# Patient Record
Sex: Male | Born: 2000 | Race: Black or African American | Hispanic: No | Marital: Single | Smoking: Never smoker
Health system: Southern US, Community
[De-identification: ages and names within clinical notes are randomized; demographics above are authoritative.]

## PROBLEM LIST (undated history)

## (undated) DIAGNOSIS — J189 Pneumonia, unspecified organism: Secondary | ICD-10-CM

## (undated) HISTORY — PX: CIRCUMCISION: SUR203

## (undated) HISTORY — DX: Pneumonia, unspecified organism: J18.9

---
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
## 2021-01-21 ENCOUNTER — Telehealth: Payer: Self-pay | Admitting: *Deleted

## 2021-01-21 NOTE — Telephone Encounter (Signed)
LMVM- On 11/05/2020 we enrolled you for a company named [employer] to mail a ZIO XT monitor to your home.  On 01/20/21 we received the following message from [facility].  Good afternoon,  Our office received a ZIO XT Home Enrollment order from  office in which the address is reflecting invalid in USPS address verification system:    The address for this contact is not valid or could not be verified. USPS Details: [place] Input address matched to the ZIP+4 file. [place] address missing secondary number.    Patients Initials: HG Patient IR:[id number] Ticket:[id number]   Please give [place] a call to confirm the patient address so we can proceed with shipping this order to the patient. IF YOU NEED ANY ADDITIONAL INFORMATION PLEASE CALL [employer] Customer Service [phone].  Thank you, [employer] Customer Support  ref:_[email or web address]:ref  Please contact our office at [phone] to update your address.

## 2021-02-13 ENCOUNTER — Telehealth: Payer: Self-pay | Admitting: *Deleted

## 2021-02-13 NOTE — Telephone Encounter (Signed)
° °  Pt's mom calling, she said they receive a notification that the heat monitor was returned due to pt's address. She provided pt's correct address: [address] [place], [place] [address]

## 2021-02-13 NOTE — Telephone Encounter (Signed)
Confirmed address had been corrected in Epic.  Sent message to [employer] to please ship a monitor to the corrected address.

## 2021-03-03 DIAGNOSIS — Z113 Encounter for screening for infections with a predominantly sexual mode of transmission: Secondary | ICD-10-CM | POA: Diagnosis not present

## 2021-04-24 DIAGNOSIS — R002 Palpitations: Secondary | ICD-10-CM | POA: Diagnosis not present

## 2021-04-24 DIAGNOSIS — Z0000 Encounter for general adult medical examination without abnormal findings: Secondary | ICD-10-CM | POA: Diagnosis not present

## 2021-04-24 DIAGNOSIS — R5383 Other fatigue: Secondary | ICD-10-CM | POA: Diagnosis not present

## 2021-04-24 DIAGNOSIS — Z833 Family history of diabetes mellitus: Secondary | ICD-10-CM | POA: Diagnosis not present

## 2021-04-29 NOTE — Progress Notes (Signed)
?Cardiology Office Note:   ? ?Date:  04/30/2021  ? ?ID:  Kyle Pacheco., DOB 07-08-2000, MRN [id number] ? ?PCP:  Ancil Linsey, MD ?[facility] Cardiologist: Little Ishikawa, MD  ? ?Reason for visit: Follow-up ? ?History of Present Illness:   ? ?Kyle Pacheco. is a 21 y.o. male with a family history of hypertrophic heart cardiomyopathy.  His mother follows with Dr. Bjorn Pippin and has an ICD for her hypertrophic cardiomyopathy.  Patient reported palpitations described as fluttering 1-2 times per week in the summer.  Patient is in school at [employer], plays on football and wrestling team. ? ?He last saw Dr. Bjorn Pippin in October 2022.  Echo and Zio patch were ordered.  It showed EF of 55 to 60%, no LVH, normal RV systolic function, mildly enlarged RV size, normal pulmonary pressures, mildly dilated right atria, mild to moderate tricuspid valve regurgitation. ? ?He saw his PCP on March 30 and complained of palpitations and chest pain.  Therefore follow-up with cardiology was arranged for today. ? ?Today, he states he never received a Zio patch in October 2022.  He complains about 2 different palpitation feelings.   ? ?He feels his heart fluttering proximately 1-2 times every 2 weeks since he was 21 years old.  He describes this as a fish flopping on a deck.  This is associated with some chest discomfort/pressure, shortness of breath and lightheadedness.  This occurs suddenly and lasts for 5 to 15 seconds. ? ?For the past 1.5 months, he feels his heart rate sometimes slow down where he feels like something is grabbing his heart and pressing on it.  This also lasts between 5 to 15 seconds and makes him feel tired and nauseous and that he needs to stop what he is doing.  This occurs 2-6 times per day. ? ?Otherwise, he states he is able to workout without chest pain and shortness of breath.  He also denies PND, orthopnea, lower extremity edema and syncope.  He had blood work with his PCP on March  30 with normal thyroid, CBC and metabolic panel. ? ?  ?Past Medical History:  ?Diagnosis Date  ? Pneumonia 2015  ? RLL  ? ? ?Past Surgical History:  ?Procedure Laterality Date  ? CIRCUMCISION    ? at 21 years of age  ? ? ?Current Medications: ?No outpatient medications have been marked as taking for the 04/30/21 encounter (Office Visit) with Cannon Kettle, PA-C.  ?  ? ?Allergies:   Patient has no known allergies.  ? ?Social History  ? ?Socioeconomic History  ? Marital status: Single  ?  Spouse name: Not on file  ? Number of children: Not on file  ? Years of education: Not on file  ? Highest education level: Not on file  ?Occupational History  ? Not on file  ?Tobacco Use  ? Smoking status: Never  ?  Passive exposure: Yes  ? Smokeless tobacco: Never  ?Vaping Use  ? Vaping Use: Never used  ?Substance and Sexual Activity  ? Alcohol use: No  ? Drug use: No  ? Sexual activity: Not on file  ?Other Topics Concern  ? Not on file  ?Social History Narrative  ? Not on file  ? ?Social Determinants of Health  ? ?Financial Resource Strain: Not on file  ?Food Insecurity: Not on file  ?Transportation Needs: Not on file  ?Physical Activity: Not on file  ?Stress: Not on file  ?Social Connections: Not on file  ?  ? ?  Family History: ?The patient's family history includes Diabetes in his maternal grandfather; Hypertension in his maternal grandfather, maternal grandmother, and mother. ? ?ROS:   ?Please see the history of present illness.    ? ?EKGs/Labs/Other Studies Reviewed:   ? ?EKG:  The ekg ordered today demonstrates sinus bradycardia with early repolarization, heart rate 44, PR interval 146 ms QRS 86 ms.. ? ?Recent Labs: ?No results found for requested labs within last 8760 hours.  ? ?Recent Lipid Panel ?No results found for: CHOL, TRIG, HDL, LDLCALC, LDLDIRECT ? ?Physical Exam:   ? ?VS:  BP 126/72   Pulse (!) 44   Ht 5\' 9"  (1.753 m)   Wt 214 lb 6.4 oz (97.3 kg)   SpO2 99%   BMI 31.66 kg/m?    ?No data found. ? ?Wt Readings  from Last 3 Encounters:  ?04/30/21 214 lb 6.4 oz (97.3 kg)  ?11/05/20 219 lb 9.6 oz (99.6 kg) (97 %, Z= 1.86)*  ?08/20/20 220 lb 9.6 oz (100.1 kg) (97 %, Z= 1.89)*  ? ?* Growth percentiles are based on CDC (Boys, 2-20 Years) data.  ?  ? ?GEN:  Well nourished, well developed in no acute distress ?HEENT: Normal ?NECK: No JVD; No carotid bruits ?CARDIAC: RRR, no murmurs, rubs, gallops ?RESPIRATORY:  Clear to auscultation without rales, wheezing or rhonchi  ?ABDOMEN: Soft, non-tender, non-distended ?MUSCULOSKELETAL: No edema; No deformity  ?SKIN: Warm and dry ?NEUROLOGIC:  Alert and oriented ?PSYCHIATRIC:  Normal affect  ? ?  ?ASSESSMENT AND PLAN  ? ?Palpitations ?-EKG today shows sinus bradycardia with early repolarization, heart rate 44, PR interval 146 ms QRS 86 ms.. ?-Order 2-week Zio patch. ? ?Family history of hypertrophic cardiomyopathy ?-mother has HOCM with ICD in place ?-2D echo 11/2020 with no LVH ? ?Tricuspid regurgitation ?-Mild to moderate on echo November 2022 ?-No heart failure symptoms. ? ?Disposition - Follow-up in 2 to 3 months with Dr. December 2022. ? ? ?Medication Adjustments/Labs and Tests Ordered: ?Current medicines are reviewed at length with the patient today.  Concerns regarding medicines are outlined above.  ?Orders Placed This Encounter  ?Procedures  ? LONG TERM MONITOR (3-14 DAYS)  ? EKG 12-Lead  ? ?No orders of the defined types were placed in this encounter. ? ? ?Patient Instructions  ?Medication Instructions:  ?None ?*If you need a refill on your cardiac medications before your next appointment, please call your pharmacy* ? ? ?Lab Work: ?None ?If you have labs (blood work) drawn today and your tests are completely normal, you will receive your results only by: ?MyChart Message (if you have MyChart) OR ?A paper copy in the mail ?If you have any lab test that is abnormal or we need to change your treatment, we will call you to review the results. ? ? ?Testing/Procedures: ?ZIO XT- Long Term  Monitor Instructions ? ?Your physician has requested you wear a ZIO patch monitor for 14 days.  ?This is a single patch monitor. [employer] supplies one patch monitor per enrollment. Additional ?stickers are not available. Please do not apply patch if you will be having a Nuclear Stress Test,  ?Echocardiogram, Cardiac CT, MRI, or Chest Xray during the period you would be wearing the  ?monitor. The patch cannot be worn during these tests. You cannot remove and re-apply the  ?ZIO XT patch monitor.  ?Your ZIO patch monitor will be mailed 3 day USPS to your address on file. It may take 3-5 days  ?to receive your monitor after you have been enrolled.  ?Once  you have received your monitor, please review the enclosed instructions. Your monitor  ?has already been registered assigning a specific monitor serial # to you. ? ?Billing and Patient Assistance Program Information ? ?We have supplied [employer] with any of your insurance information on file for billing purposes. ?[employer] offers a sliding scale Patient Assistance Program for patients that do not have  ?insurance, or whose insurance does not completely cover the cost of the ZIO monitor.  ?You must apply for the Patient Assistance Program to qualify for this discounted rate.  ?To apply, please call [employer] at [phone], select option 4, select option 2, ask to apply for  ?Patient Assistance Program. Meredeth [employer] will ask your household income, and how many people  ?are in your household. They will quote your out-of-pocket cost based on that information.  ?[employer] will also be able to set up a 4599-month, interest-free payment plan if needed. ? ?Applying the monitor ?  ?Shave hair from upper left chest.  ?Hold abrader disc by orange tab. Rub abrader in 40 strokes over the upper left chest as  ?indicated in your monitor instructions.  ?Clean area with 4 enclosed alcohol pads. Let dry.  ?Apply patch as indicated in monitor instructions. Patch will be placed under collarbone on left   ?side of chest with arrow pointing upward.  ?Rub patch adhesive wings for 2 minutes. Remove white label marked "1". Remove the white  ?label marked "2". Rub patch adhesive wings for 2 additional minutes.  ?Wh

## 2021-04-30 ENCOUNTER — Ambulatory Visit: Payer: Medicaid Other

## 2021-04-30 ENCOUNTER — Ambulatory Visit (INDEPENDENT_AMBULATORY_CARE_PROVIDER_SITE_OTHER): Payer: Medicaid Other | Admitting: Physician Assistant

## 2021-04-30 ENCOUNTER — Encounter: Payer: Self-pay | Admitting: Physician Assistant

## 2021-04-30 VITALS — BP 126/72 | HR 44 | Ht 69.0 in | Wt 214.4 lb

## 2021-04-30 DIAGNOSIS — R002 Palpitations: Secondary | ICD-10-CM | POA: Diagnosis not present

## 2021-04-30 DIAGNOSIS — R072 Precordial pain: Secondary | ICD-10-CM

## 2021-04-30 NOTE — Patient Instructions (Signed)
Medication Instructions:  ?None ?*If you need a refill on your cardiac medications before your next appointment, please call your pharmacy* ? ? ?Lab Work: ?None ?If you have labs (blood work) drawn today and your tests are completely normal, you will receive your results only by: ?MyChart Message (if you have MyChart) OR ?A paper copy in the mail ?If you have any lab test that is abnormal or we need to change your treatment, we will call you to review the results. ? ? ?Testing/Procedures: ?ZIO XT- Long Term Monitor Instructions ? ?Your physician has requested you wear a ZIO patch monitor for 14 days.  ?This is a single patch monitor. [employer] supplies one patch monitor per enrollment. Additional ?stickers are not available. Please do not apply patch if you will be having a Nuclear Stress Test,  ?Echocardiogram, Cardiac CT, MRI, or Chest Xray during the period you would be wearing the  ?monitor. The patch cannot be worn during these tests. You cannot remove and re-apply the  ?ZIO XT patch monitor.  ?Your ZIO patch monitor will be mailed 3 day USPS to your address on file. It may take 3-5 days  ?to receive your monitor after you have been enrolled.  ?Once you have received your monitor, please review the enclosed instructions. Your monitor  ?has already been registered assigning a specific monitor serial # to you. ? ?Billing and Patient Assistance Program Information ? ?We have supplied [employer] with any of your insurance information on file for billing purposes. ?[employer] offers a sliding scale Patient Assistance Program for patients that do not have  ?insurance, or whose insurance does not completely cover the cost of the ZIO monitor.  ?You must apply for the Patient Assistance Program to qualify for this discounted rate.  ?To apply, please call [employer] at [phone], select option 4, select option 2, ask to apply for  ?Patient Assistance Program. Meredeth Ide will ask your household income, and how many people  ?are in  your household. They will quote your out-of-pocket cost based on that information.  ?[employer] will also be able to set up a 21-month, interest-free payment plan if needed. ? ?Applying the monitor ?  ?Shave hair from upper left chest.  ?Hold abrader disc by orange tab. Rub abrader in 40 strokes over the upper left chest as  ?indicated in your monitor instructions.  ?Clean area with 4 enclosed alcohol pads. Let dry.  ?Apply patch as indicated in monitor instructions. Patch will be placed under collarbone on left  ?side of chest with arrow pointing upward.  ?Rub patch adhesive wings for 2 minutes. Remove white label marked "1". Remove the white  ?label marked "2". Rub patch adhesive wings for 2 additional minutes.  ?While looking in a mirror, press and release button in center of patch. A small green light will  ?flash 3-4 times. This will be your only indicator that the monitor has been turned on.  ?Do not shower for the first 24 hours. You may shower after the first 24 hours.  ?Press the button if you feel a symptom. You will hear a small click. Record Date, Time and  ?Symptom in the Patient Logbook.  ?When you are ready to remove the patch, follow instructions on the last 2 pages of Patient  ?Logbook. Stick patch monitor onto the last page of Patient Logbook.  ?Place Patient Logbook in the blue and white box. Use locking tab on box and tape box closed  ?securely. The blue and white box has prepaid  postage on it. Please place it in the mailbox as  ?soon as possible. Your physician should have your test results approximately 7 days after the  ?monitor has been mailed back to [facility].  ?Call [facility] at [phone] if you have questions regarding  ?your ZIO XT patch monitor. Call them immediately if you see an orange light blinking on your  ?monitor.  ?If your monitor falls off in less than 4 days, contact our Monitor department at [phone].  ?If your monitor becomes loose or falls off  after 4 days call [employer] at [phone] for  ?suggestions on securing your monitor  ?Follow-Up: ?At [facility], you and your health needs are our priority.  As part of our continuing mission to provide you with exceptional heart care, we have created designated Provider Care Teams.  These Care Teams include your primary Cardiologist (physician) and Advanced Practice Providers (APPs -  Physician Assistants and Nurse Practitioners) who all work together to provide you with the care you need, when you need it. ? ?We recommend signing up for the patient portal called "MyChart".  Sign up information is provided on this After Visit Summary.  MyChart is used to connect with patients for Virtual Visits (Telemedicine).  Patients are able to view lab/test results, encounter notes, upcoming appointments, etc.  Non-urgent messages can be sent to your provider as well.   ?To learn more about what you can do with MyChart, go to [email or web address].   ? ?Your next appointment:   ?2-3 month(s) ? ?The format for your next appointment:   ?In Person ? ?Provider:   ?Little Ishikawa, MD   ? ? ?  ?

## 2021-04-30 NOTE — Progress Notes (Unsigned)
Enrolled for [employer] to mail a ZIO XT long term holter monitor to the patients address on file.  ? ?Dr. Schumann to read. ?

## 2021-07-27 NOTE — Progress Notes (Signed)
Cardiology Office Note:    Date:  08/01/2021   ID:  Kyle Gearing., DOB [birth year], MRN [id number]  PCP:  Ancil Linsey, MD  Cardiologist:  Little Ishikawa, MD  Electrophysiologist:  None   Referring MD: Ancil Linsey, MD   Chief Complaint  Patient presents with   Palpitations    History of Present Illness:    Kyle Banko. is a 21 y.o. male with no significant past medical history who presents for follow-up.  He was referred by Dr. Kennedy Bucker for evaluation of family history of hypertrophic cardiomyopathy, initially seen 11/05/2020.  His mother, Kyle Pacheco, is a patient of mine and has hypertrophic cardiomyopathy and had ICD placed.  He denies any chest pain, dyspnea, headedness, syncope, or lower extremity edema.  Does report having palpitations.  Describes as fluttering feeling, was occurring 1-2 times per week in the summer, but none for the last 3 weeks.  He denies any lower extremity edema.  Does not smoke.  He is in school at [employer], plays on football and wrestling team.  Echocardiogram 12/03/2020 showed normal biventricular function, no LVH, mild to moderate TR.  Since last clinic visit, he reports that he is doing okay.  Palpitations have improved, now occurring about twice per week lasting for 5 to 10 seconds.  Reports having chest pain about once per week, describes as right upper chest pain that occurs with coughing or deep breathing.  Lasts for few seconds and resolves.  He denies any dyspnea, lightheadedness, syncope, lower extremity edema.   Past Medical History:  Diagnosis Date   Pneumonia 2015   RLL    Past Surgical History:  Procedure Laterality Date   CIRCUMCISION     at 21 years of age    Current Medications: No outpatient medications have been marked as taking for the 08/01/21 encounter (Office Visit) with Little Ishikawa, MD.     Allergies:   Patient has no known allergies.   Social History   Socioeconomic History   Marital  status: Single    Spouse name: Not on file   Number of children: Not on file   Years of education: Not on file   Highest education level: Not on file  Occupational History   Not on file  Tobacco Use   Smoking status: Never    Passive exposure: Yes   Smokeless tobacco: Never  Vaping Use   Vaping Use: Never used  Substance and Sexual Activity   Alcohol use: No   Drug use: No   Sexual activity: Not on file  Other Topics Concern   Not on file  Social History Narrative   Not on file   Social Determinants of Health   Financial Resource Strain: Not on file  Food Insecurity: Not on file  Transportation Needs: Not on file  Physical Activity: Not on file  Stress: Not on file  Social Connections: Not on file     Family History: The patient's family history includes Diabetes in his maternal grandfather; Hypertension in his maternal grandfather, maternal grandmother, and mother.  ROS:   Please see the history of present illness.     All other systems reviewed and are negative.  EKGs/Labs/Other Studies Reviewed:    The following studies were reviewed today:   EKG:  EKG is not ordered today.    Recent Labs: No results found for requested labs within last 365 days.  Recent Lipid Panel No results found for: "CHOL", "TRIG", "HDL", "CHOLHDL", "VLDL", "  LDLCALC", "LDLDIRECT"  Physical Exam:    VS:  BP 110/78   Pulse 89   Ht 5\' 9"  (1.753 m)   Wt 211 lb 9.6 oz (96 kg)   SpO2 99%   BMI 31.25 kg/m     Wt Readings from Last 3 Encounters:  08/01/21 211 lb 9.6 oz (96 kg)  04/30/21 214 lb 6.4 oz (97.3 kg)  11/05/20 219 lb 9.6 oz (99.6 kg) (97 %, Z= 1.86)*   * Growth percentiles are based on CDC (Boys, 2-20 Years) data.     GEN:  Well nourished, well developed in no acute distress HEENT: Normal NECK: No JVD; No carotid bruits LYMPHATICS: No lymphadenopathy CARDIAC: RRR, no murmurs, rubs, gallops RESPIRATORY:  Clear to auscultation without rales, wheezing or rhonchi   ABDOMEN: Soft, non-tender, non-distended MUSCULOSKELETAL:  No edema; No deformity  SKIN: Warm and dry NEUROLOGIC:  Alert and oriented x 3 PSYCHIATRIC:  Normal affect   ASSESSMENT:    1. Family history of hypertrophic cardiomyopathy   2. Palpitations   3. Precordial chest pain   4. Tricuspid valve insufficiency, unspecified etiology     PLAN:    Family history of hypertrophic cardiomyopathy: Mother has HCM.  Echocardiogram 12/03/2020 showed no LVH.  Palpitations: Description concerning for arrhythmia, he wore Zio patch x2 weeks but did not mail it back.  He brought his monitor with him today as he lost the box to mail it back.  We discussed with [employer], they will mail a box to him so he can send the monitor back to them -Follow-up monitor results  Tricuspid regurgitation: Mild to moderate TR on echo 12/03/2020, will monitor  Chest pain: Description suggest noncardiac etiology, as describes right-sided pain that occurs with coughing and lasts for few seconds, suspect musculoskeletal chest pain.  No exertional chest pain  RTC in 1 year  Medication Adjustments/Labs and Tests Ordered: Current medicines are reviewed at length with the patient today.  Concerns regarding medicines are outlined above.  No orders of the defined types were placed in this encounter.  No orders of the defined types were placed in this encounter.    Patient Instructions  Medication Instructions:  Your physician recommends that you continue on your current medications as directed. Please refer to the Current Medication list given to you today.  *If you need a refill on your cardiac medications before your next appointment, please call your pharmacy*   Lab Work: NONE If you have labs (blood work) drawn today and your tests are completely normal, you will receive your results only by: MyChart Message (if you have MyChart) OR A paper copy in the mail If you have any lab test that is abnormal or we need  to change your treatment, we will call you to review the results.   Testing/Procedures: NONE   Follow-Up: At [facility], you and your health needs are our priority.  As part of our continuing mission to provide you with exceptional heart care, we have created designated Provider Care Teams.  These Care Teams include your primary Cardiologist (physician) and Advanced Practice Providers (APPs -  Physician Assistants and Nurse Practitioners) who all work together to provide you with the care you need, when you need it.  Your next appointment:   1 year(s)  The format for your next appointment:   In Person  Provider:   [facility], MD {    Other Instructions NONE          Signed, Little Ishikawa  Karlyne Greenspan, MD  08/01/2021 8:57 AM    [facility]

## 2021-08-01 ENCOUNTER — Ambulatory Visit (INDEPENDENT_AMBULATORY_CARE_PROVIDER_SITE_OTHER): Payer: Medicaid Other | Admitting: Cardiology

## 2021-08-01 ENCOUNTER — Encounter: Payer: Self-pay | Admitting: Cardiology

## 2021-08-01 VITALS — BP 110/78 | HR 89 | Ht 69.0 in | Wt 211.6 lb

## 2021-08-01 DIAGNOSIS — R002 Palpitations: Secondary | ICD-10-CM

## 2021-08-01 DIAGNOSIS — Z8249 Family history of ischemic heart disease and other diseases of the circulatory system: Secondary | ICD-10-CM | POA: Diagnosis not present

## 2021-08-01 DIAGNOSIS — I071 Rheumatic tricuspid insufficiency: Secondary | ICD-10-CM

## 2021-08-01 DIAGNOSIS — R072 Precordial pain: Secondary | ICD-10-CM | POA: Diagnosis not present

## 2021-08-01 NOTE — Patient Instructions (Signed)
Medication Instructions:  Your physician recommends that you continue on your current medications as directed. Please refer to the Current Medication list given to you today.  *If you need a refill on your cardiac medications before your next appointment, please call your pharmacy*   Lab Work: NONE If you have labs (blood work) drawn today and your tests are completely normal, you will receive your results only by: MyChart Message (if you have MyChart) OR A paper copy in the mail If you have any lab test that is abnormal or we need to change your treatment, we will call you to review the results.   Testing/Procedures: NONE   Follow-Up: At [facility], you and your health needs are our priority.  As part of our continuing mission to provide you with exceptional heart care, we have created designated Provider Care Teams.  These Care Teams include your primary Cardiologist (physician) and Advanced Practice Providers (APPs -  Physician Assistants and Nurse Practitioners) who all work together to provide you with the care you need, when you need it.  Your next appointment:   1 year(s)  The format for your next appointment:   In Person  Provider:   Little Ishikawa, MD {    Other Instructions NONE

## 2021-08-13 DIAGNOSIS — I959 Hypotension, unspecified: Secondary | ICD-10-CM | POA: Diagnosis not present

## 2021-08-13 DIAGNOSIS — R55 Syncope and collapse: Secondary | ICD-10-CM | POA: Diagnosis not present

## 2021-08-13 DIAGNOSIS — R1111 Vomiting without nausea: Secondary | ICD-10-CM | POA: Diagnosis not present

## 2021-08-13 DIAGNOSIS — R110 Nausea: Secondary | ICD-10-CM | POA: Diagnosis not present

## 2021-11-20 ENCOUNTER — Emergency Department (HOSPITAL_BASED_OUTPATIENT_CLINIC_OR_DEPARTMENT_OTHER)
Admission: EM | Admit: 2021-11-20 | Discharge: 2021-11-20 | Disposition: A | Discharge status: Home / Self Care | Payer: Medicaid Other | Attending: Emergency Medicine | Admitting: Emergency Medicine

## 2021-11-20 ENCOUNTER — Ambulatory Visit
Admission: EM | Admit: 2021-11-20 | Discharge: 2021-11-20 | Disposition: A | Discharge status: Home / Self Care | Payer: Medicaid Other

## 2021-11-20 ENCOUNTER — Encounter: Payer: Self-pay | Admitting: Emergency Medicine

## 2021-11-20 ENCOUNTER — Encounter (HOSPITAL_BASED_OUTPATIENT_CLINIC_OR_DEPARTMENT_OTHER): Payer: Self-pay | Admitting: Emergency Medicine

## 2021-11-20 ENCOUNTER — Other Ambulatory Visit: Payer: Self-pay

## 2021-11-20 ENCOUNTER — Emergency Department (HOSPITAL_BASED_OUTPATIENT_CLINIC_OR_DEPARTMENT_OTHER): Payer: Medicaid Other

## 2021-11-20 DIAGNOSIS — S0990XA Unspecified injury of head, initial encounter: Secondary | ICD-10-CM | POA: Diagnosis not present

## 2021-11-20 DIAGNOSIS — W228XXA Striking against or struck by other objects, initial encounter: Secondary | ICD-10-CM | POA: Diagnosis not present

## 2021-11-20 DIAGNOSIS — Z7722 Contact with and (suspected) exposure to environmental tobacco smoke (acute) (chronic): Secondary | ICD-10-CM | POA: Insufficient documentation

## 2021-11-20 DIAGNOSIS — Y9361 Activity, american tackle football: Secondary | ICD-10-CM | POA: Insufficient documentation

## 2021-11-20 DIAGNOSIS — S060X0A Concussion without loss of consciousness, initial encounter: Secondary | ICD-10-CM

## 2021-11-20 DIAGNOSIS — R519 Headache, unspecified: Secondary | ICD-10-CM

## 2021-11-20 DIAGNOSIS — S0990XD Unspecified injury of head, subsequent encounter: Secondary | ICD-10-CM | POA: Diagnosis not present

## 2021-11-20 NOTE — ED Triage Notes (Signed)
Pt arrives to ED with c/o concussion. Pt reports head injury on 10/21. He reports headache, photosensitivity, fatigue, trouble sleeping, and memory loss.

## 2021-11-20 NOTE — Discharge Instructions (Signed)
Note your work-up today was overall reassuring.  Imaging of your head showed no acute abnormalities.  Recommend resting from physical exercise or any activity that could result in additional head trauma.  I provided a school note to allow your brain time to rest.  Recommend reevaluation by your primary care provider in 3 to 5 days.  Take Tylenol/ibuprofen as needed for pain/headache.  Please do not hesitate to return to emergency department for worrisome signs and symptoms we discussed become apparent.

## 2021-11-20 NOTE — Discharge Instructions (Addendum)
Advised to report to the emergency room for evaluation of concussion.

## 2021-11-20 NOTE — ED Triage Notes (Signed)
Pt had possible head injury during football game on Monday; pt sts was seen by athletic trainers that day; pt sts having sound and light sensitivity and lethargy since injury

## 2021-11-20 NOTE — ED Provider Notes (Addendum)
MEDCENTER [facility] EMERGENCY DEPT Provider Note   CSN: [id number] Arrival date & time: 11/20/21  1321     History  Chief Complaint  Patient presents with   Concussion    Kyle Pacheco. is a 21 y.o. male.  HPI   21 year old male presents emergency department with complaints of head trauma.  Patient states that he was in a football game this past Sunday and had multiple episodes where he was hit in the head.  He denies loss of consciousness, episodes of nausea/vomiting.  He states that since then, he has had difficulty with headache, light sensitivity, sensitivity to sound, generalized fatigue, difficulty concentrating.  Patient states he was seen by an urgent care and sent to emergency department for further evaluation.  Patient is accompanied by mother who is concerned about intracranial pathology and is requesting CT imaging of the head.  Denies visual disturbance, weakness/sensory deficits, feelings of imbalance, slurred speech, facial droop.  Denies current blood thinner use.  No significant past medical history.  Home Medications Prior to Admission medications   Not on File      Allergies    Patient has no known allergies.    Review of Systems   Review of Systems  All other systems reviewed and are negative.   Physical Exam Updated Vital Signs BP 135/62 (BP Location: Right Arm)   Pulse 60   Temp 98.4 F (36.9 C)   Resp 14   Ht 5\' 9"  (1.753 m)   Wt 97.5 kg   SpO2 100%   BMI 31.75 kg/m  Physical Exam Vitals and nursing note reviewed.  Constitutional:      General: He is not in acute distress.    Appearance: He is well-developed.     Comments: Patient wearing sunglasses in no acute distress.  HENT:     Head: Normocephalic and atraumatic.  Eyes:     Conjunctiva/sclera: Conjunctivae normal.  Cardiovascular:     Rate and Rhythm: Normal rate and regular rhythm.     Heart sounds: No murmur heard. Pulmonary:     Effort: Pulmonary effort is normal. No  respiratory distress.     Breath sounds: Normal breath sounds.  Abdominal:     Palpations: Abdomen is soft.     Tenderness: There is no abdominal tenderness.  Musculoskeletal:        General: No swelling.     Cervical back: Neck supple.  Skin:    General: Skin is warm and dry.     Capillary Refill: Capillary refill takes less than 2 seconds.  Neurological:     Mental Status: He is alert.     Comments: Alert and oriented to self, place, time and event.   Speech is fluent, clear without dysarthria or dysphasia.   Strength 5/5 in upper/lower extremities   Sensation intact in upper/lower extremities   Normal gait.  Negative Romberg. No pronator drift.  Normal finger-to-nose and feet tapping.  CN I not tested  CN II grossly intact visual fields bilaterally. Did not visualize posterior eye.  CN III, IV, VI PERRLA and EOMs intact bilaterally  CN V Intact sensation to sharp and light touch to the face  CN VII facial movements symmetric  CN VIII not tested  CN IX, X no uvula deviation, symmetric rise of soft palate  CN XI 5/5 SCM and trapezius strength bilaterally  CN XII Midline tongue protrusion, symmetric L/R movements   Psychiatric:        Mood and Affect: Mood normal.  ED Results / Procedures / Treatments   Labs (all labs ordered are listed, but only abnormal results are displayed) Labs Reviewed - No data to display  EKG None  Radiology CT Head Wo Contrast  Result Date: 11/20/2021 CLINICAL DATA:  Head trauma, moderate-severe EXAM: CT HEAD WITHOUT CONTRAST TECHNIQUE: Contiguous axial images were obtained from the base of the skull through the vertex without intravenous contrast. RADIATION DOSE REDUCTION: This exam was performed according to the departmental dose-optimization program which includes automated exposure control, adjustment of the mA and/or kV according to patient size and/or use of iterative reconstruction technique. COMPARISON:  None Available. FINDINGS:  Brain: No evidence of acute infarction, hemorrhage, hydrocephalus, extra-axial collection or mass lesion/mass effect. Vascular: No hyperdense vessel identified. Skull: No acute fracture. Sinuses/Orbits: Visualized sinuses are clear. No acute orbital findings. Other: No mastoid effusions. IMPRESSION: No evidence of acute intracranial abnormality. Electronically Signed   By: Feliberto Harts M.D.   On: 11/20/2021 15:19    Procedures Procedures    Medications Ordered in ED Medications - No data to display  ED Course/ Medical Decision Making/ A&P                           Medical Decision Making Amount and/or Complexity of Data Reviewed Radiology: ordered.   This patient presents to the ED for concern of head injury, this involves an extensive number of treatment options, and is a complaint that carries with it a high risk of complications and morbidity.  The differential diagnosis includes CVA, fracture, strain/sprain, dislocation, concussion   Co morbidities that complicate the patient evaluation  See HPI   Additional history obtained:  Additional history obtained from EMR External records from outside source obtained and reviewed including urgent care record from earlier today   Lab Tests:  N/a   Imaging Studies ordered:  I ordered imaging studies including CT head I independently visualized and interpreted imaging which showed no acute abnormalities I agree with the radiologist interpretation   Cardiac Monitoring: / EKG:  The patient was maintained on a cardiac monitor.  I personally viewed and interpreted the cardiac monitored which showed an underlying rhythm of: Sinus rhythm   Consultations Obtained:  N/a   Problem List / ED Course / Critical interventions / Medication management  Head trauma Reevaluation of the patient showed that the patient stayed the same I have reviewed the patients home medicines and have made adjustments as needed   Social  Determinants of Health:  Secondhand smoking.  Denies illicit drug use.   Test / Admission - Considered:  Head injury Vitals signs within normal range and stable throughout visit. Imaging studies significant for: See above Patient symptoms likely secondary to concussion.  CT imaging of the head negative for any acute abnormalities.  Imaging was obtained at patient's request given multiple visits.  Patient reassured with overall negative findings.  Symptomatic therapy recommended at home with Tylenol/ibuprofen as needed for pain/headache.  Recommend rest physical activity as well as academic rigors until tolerable, obtaining adequate rest and hydration/food intake.Marland Kitchen  Recommend follow-up with PCP in 3 to 5 days.  Treatment plan discussed with patient and he acknowledged understand was agreeable to said plan. Worrisome signs and symptoms were discussed with the patient, and the patient acknowledged understanding to return to the ED if noticed. Patient was stable upon discharge.          Final Clinical Impression(s) / ED Diagnoses Final diagnoses:  Injury  of head, subsequent encounter    Rx / DC Orders ED Discharge Orders     None         Wilnette Kales, [place] 11/20/21 [place], [place], [place] 11/20/21 1545    Lajean Saver, MD 11/21/21 1407

## 2021-11-20 NOTE — ED Provider Notes (Signed)
[facility]    CSN: [id number] Arrival date & time: 11/20/21  2595      History   Chief Complaint Chief Complaint  Patient presents with   Head Injury    HPI Kyle Pacheco. is a 21 y.o. male.   21 year old male presents for concussion.  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  Patient indicates he did not have LOC.  Patient indicates he continued to play throughout the rest of the game without problems.  Patient indicates that after the game he started having mild headache and fatigue.  He indicates that on Sunday he started having light sensitivity, sound sensitivity, fatigue, dull persistent headache, with intermittent episodes of dizziness and feeling off balance when he walked.  Patient indicates he did not have any nausea or vomiting.  He indicates he saw the athletic trainers on Monday and they did a concussion exam which he indicates was positive.  He indicates the athletic trainers took him out of play until the symptoms resolved.  Patient comes in today and indicates he is still having light sensitivity, he is wearing sunglasses in the exam room, sound sensitivity, headache which is dull and a 6 on a scale of 1-10.  He also indicates that he is having some intermittent dizziness when he walks feeling like he is off balance.  He also indicates he is having fatigue and tired feeling.  He indicates that he is having problems focusing and gathering his thoughts and expressing thoughts and ideas through his speech.  He does indicate that he has some mild headache on the posterior side of his neck.  He is not having any unusual numbness or tingling of the upper or lower extremities or weakness.  He denies having any nausea or vomiting, no vision changes.   Head Injury Associated symptoms: headache     Past Medical  History:  Diagnosis Date   Pneumonia 2015   RLL    There are no problems to display for this patient.   Past Surgical History:  Procedure Laterality Date   CIRCUMCISION     at 21 years of age       Home Medications    Prior to Admission medications   Not on File    Family History Family History  Problem Relation Age of Onset   Hypertension Mother    Hypertension Maternal Grandmother    Diabetes Maternal Grandfather    Hypertension Maternal Grandfather     Social History Social History   Tobacco Use   Smoking status: Never    Passive exposure: Yes   Smokeless tobacco: Never  Vaping Use   Vaping Use: Never used  Substance Use Topics   Alcohol use: No   Drug use: No     Allergies   Patient has no known allergies.   Review of Systems Review of Systems  Constitutional:  Positive for fatigue.  Neurological:  Positive for light-headedness and headaches.     Physical Exam Triage Vital Signs ED Triage Vitals  Enc Vitals Group     BP 11/20/21 0843 113/65     Pulse Rate 11/20/21 0843 (!) 54     Resp 11/20/21 0843 18     Temp 11/20/21 0843 97.9 F (36.6 C)     Temp Source 11/20/21 0843 Oral  SpO2 11/20/21 0843 98 %     Weight --      Height --      Head Circumference --      Peak Flow --      Pain Score 11/20/21 0844 5     Pain Loc --      Pain Edu? --      Excl. in [place]? --    No data found.  Updated Vital Signs BP 113/65 (BP Location: Left Arm)   Pulse (!) 54   Temp 97.9 F (36.6 C) (Oral)   Resp 18   SpO2 98%   Visual Acuity Right Eye Distance:   Left Eye Distance:   Bilateral Distance:    Right Eye Near:   Left Eye Near:    Bilateral Near:     Physical Exam Constitutional:      Appearance: Normal appearance.     Comments: Patient appears normal in appearance but is slow to respond to questions although he does respond to pointed and focus questions rationally and appropriately.  HENT:     Right Ear: Tympanic membrane and  ear canal normal.     Left Ear: Tympanic membrane and ear canal normal.     Mouth/Throat:     Mouth: Mucous membranes are moist.     Pharynx: Oropharynx is clear. Uvula midline.  Eyes:     General: Vision grossly intact. Gaze aligned appropriately.     Extraocular Movements: Extraocular movements intact.  Cardiovascular:     Rate and Rhythm: Normal rate and regular rhythm.     Heart sounds: Normal heart sounds.  Pulmonary:     Effort: Pulmonary effort is normal.     Breath sounds: Normal breath sounds and air entry. No wheezing, rhonchi or rales.  Musculoskeletal:     Cervical back: Full passive range of motion without pain.  Lymphadenopathy:     Cervical: No cervical adenopathy.  Neurological:     Mental Status: He is alert and oriented to person, place, and time.     Cranial Nerves: Cranial nerves 2-12 are intact.     Motor: Motor function is intact.     Coordination: Coordination is intact.      UC Treatments / Results  Labs (all labs ordered are listed, but only abnormal results are displayed) Labs Reviewed - No data to display  EKG   Radiology No results found.  Procedures Procedures (including critical care time)  Medications Ordered in UC Medications - No data to display  Initial Impression / Assessment and Plan / UC Course  I have reviewed the triage vital signs and the nursing notes.  Pertinent labs & imaging results that were available during my care of the patient were reviewed by me and considered in my medical decision making (see chart for details).    Plan: 1.  Concussion will be treated with the following: A.  Patient advised to continue to be out of school and no stimulation until advised otherwise, also out of football practice or training activity until advised otherwise. B.  Patient advised to report to the emergency room for evaluation and possible head CT scan. (patient indicates that he has an appointment to be evaluated by [facility] this morning at 9:30 AM) 2.  The headache will be treated with the following: A.  Advised take Tylenol only for headache when needed. 3.  Patient advised to follow-up with PCP or [facility] in order to get a return to  play form completed over the next several weeks. 4.  Advised to return to urgent care as needed. Final Clinical Impressions(s) / UC Diagnoses   Final diagnoses:  Concussion without loss of consciousness, initial encounter  Acute nonintractable headache, unspecified headache type     Discharge Instructions      Advised to report to the emergency room for evaluation of concussion.    ED Prescriptions   None    PDMP not reviewed this encounter.   Nyoka Lint, PA-C 11/20/21 [phone]

## 2022-03-09 DIAGNOSIS — Z113 Encounter for screening for infections with a predominantly sexual mode of transmission: Secondary | ICD-10-CM | POA: Diagnosis not present

## 2022-04-06 ENCOUNTER — Encounter: Payer: Self-pay | Admitting: Physical Medicine and Rehabilitation

## 2022-04-07 NOTE — Progress Notes (Signed)
Subjective:    Patient ID: Kyle Hertz., male    DOB: [birth year], 22 y.o.   MRN: [id number]  HPI Kyle Buntin. is a 22 y.o. year old male  who  has a past medical history of Pneumonia (2015).   They are presenting to PM&R clinic as a new patient for treatment of post-concussive syndrome . They were referred by Cleophas Dunker, MD  .  He had his initial injury in October  at [facility], playing football fell back and hit his head on the backside on the turf 2x. He also had side impacts 2 more times during that game with head strikes. He denies LOC, +helmet. No N/V at the time. He played the entire game, and didn't really notice symptoms until the next day. He had an evaluation by athletic trainers the next week.   He subsequently got a CT head in ED, which was normal. He was recommended tylenol and ibuprofen. He was referred to [place] by his athletic trainers; has not had a physician eval since. He has not been cleared to return to play, lift weights, or run. His trainers have taken him through the concussion baseline protocol test and worked on balance.   He denies vertigo, dizziness, light  sensitivity, nausea.   Chief complaint: Memory difficulty/ Focus difficulty. He is still attending classes, but finding it difficult to concentrate on professors, material. 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 He denies any accommodations for classroom after his concussion.   2nd complaint: Depression, pre-existing injury, worsened since. Does not see a therapist or psychiatrist. Never been on antidepressants.   3rd: Headaches, starting in the base of the skull and radiating to his temples. Intermittent, overall improving.   Pain Inventory Average Pain 0 Pain Right Now 0 My pain is intermittent and pain when laughing   LOCATION OF PAIN : back of head, having issues with communication, memory, cognitive   BOWEL Number of stools per week: 3-4 Oral laxative  use No   BLADDER Normal  Mobility how many minutes can you walk? No problem walking ability to climb steps?  yes do you drive?  no Do you have any goals in this area?  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  no  Physicians involved in your care Any changes since last visit?  no   Family History  Problem Relation Age of Onset   Hypertension Mother    Hypertension Maternal Grandmother    Diabetes Maternal Grandfather    Hypertension Maternal Grandfather    Social History   Socioeconomic History   Marital status: Single    Spouse name: Not on file   Number of children: Not on file   Years of education: Not on file   Highest education level: Not on file  Occupational History   Not on file  Tobacco Use   Smoking status: Never    Passive exposure: Yes   Smokeless tobacco: Never  Vaping Use   Vaping Use: Never used  Substance and Sexual Activity   Alcohol use: No   Drug use: No   Sexual activity: Not on file  Other Topics Concern   Not on file  Social History Narrative   Not on file   Social Determinants of Health   Financial Resource Strain: Not on file  Food Insecurity: Not  on file  Transportation Needs: Not on file  Physical Activity: Not on file  Stress: Not on file  Social Connections: Not on file   Past Surgical History:  Procedure Laterality Date   CIRCUMCISION     at 22 years of age   Past Medical History:  Diagnosis Date   Pneumonia 2015   RLL   There were no vitals taken for this visit.  Opioid Risk Score:   Fall Risk Score:  `1  Depression screen [facility] 2/9     07/13/2017    2:06 PM  Depression screen PHQ 2/9  Decreased Interest 0  Down, Depressed, Hopeless 0  PHQ - 2 Score 0  Altered sleeping 0  Tired, decreased energy 0  Change in appetite 0  Feeling bad or failure about yourself   0  Trouble concentrating 0  Moving slowly or fidgety/restless 0  PHQ-9 Score 0    Review of Systems  Neurological:  Positive for dizziness.  Psychiatric/Behavioral:  Positive for suicidal ideas.        Depression, anxiety      Objective:   Physical Exam   Constitutional: No apparent distress. Appropriate appearance for age.  HENT: No JVD. Neck Supple. Trachea midline. Atraumatic, normocephalic. Eyes: PERRLA. EOMI. Visual fields grossly intact. + Nystagmus and discomfort with left lateral gaze. +Discomfort with horizontal saccades Cardiovascular: RRR, no murmurs/rub/gallops. No Edema. Respiratory: CTAB. No rales, rhonchi, or wheezing. On RA.  Skin: C/D/I. No apparent lesions. MSK:      No apparent deformity.      Strength:                RUE: 5/5 SA, 5/5 EF, 5/5 EE, 5/5 WE, 5/5 FF, 5/5 FA                 LUE: 5/5 SA, 5/5 EF, 5/5 EE, 5/5 WE, 5/5 FF, 5/5 FA                 RLE: 5/5 HF, 5/5 KE, 5/5 DF, 5/5 EHL, 5/5 PF                 LLE:  5/5 HF, 5/5 KE, 5/5 DF, 5/5 EHL, 5/5 PF   Neurologic exam:  Cognition: AAO to person, place, time and event.  Language: Fluent, No substitutions or neoglisms. No dysarthria.  Memory: No apparent deficits  Insight: Good  insight into current condition.  Mood: Pleasant affect, appropriate mood. + Passive SI Sensation: To light touch intact in BL UEs and LEs  Reflexes: 2+ in BL UE and LEs. Negative Hoffman's and babinski signs bilaterally.  CN: 2-12 grossly intact.  Coordination: No apparent tremors. No ataxia on FTN, HTS bilaterally.  Spasticity: MAS 0 in all extremities.  Gait: Normal balance with tandem, heel, and toe walking.         Assessment & Plan:   Kyle Loduca. is a 22 y.o. year old male  who  has a past medical history of Pneumonia (2015).   They are presenting to PM&R clinic as a new patient for treatment of post-concussion syndome . They were referred by Dr. Sandi Carne.    Concussion without loss of consciousness, sequela  ([place]) Assessment & Plan: D/t multiple head strikes during football game 10/2021, no LOC, was not removed from play.  Ongoing symptoms are cognitive fatigue, neck pain, headaches, and worsening of pre-existing depression.   I have written a letter for academic accomodations and have not cleared you to return to  play. If things are improving, you may try light aerobic exercise on your own for 10 minutes at a time, and continue this if it does not worsen your fatigue or headaches.   Orders: -     Ambulatory referral to Physical Therapy -     MR BRAIN WO CONTRAST; Future  Cervicalgia Assessment & Plan: I have referred you to PT for neck exercises to help with pain and headaches. Also, use over the counter voltaren gel to your neck and shoulders up to 4 times daily for pain.   Orders: -     Ambulatory referral to Physical Therapy  Cognitive and behavioral changes Assessment & Plan: I have ordered an MRI of your brain, and will contact you once results are available.  See me in 3-4 weeks for re-evaluation.  Orders: -     MR BRAIN WO CONTRAST; Future -     Ambulatory referral to Psychiatry  Passive suicidal ideations Assessment & Plan: I have prescribed Duloxetine 30 mg daily for nerve pain and depression. Please call me or message through Mychart in 2 weeks to let me know how this medication is going, or sooner if any concerns. I have referred you to a psychiatrist who can assist in management once you are established with them.  Orders: -     Ambulatory referral to Psychiatry  Other orders -     DULoxetine HCl; Take 1 capsule (30 mg total) by mouth daily.  Dispense: 30 capsule; Refill: 3

## 2022-04-08 ENCOUNTER — Encounter: Payer: Self-pay | Admitting: Physical Medicine and Rehabilitation

## 2022-04-08 ENCOUNTER — Encounter
Payer: Medicaid Other | Attending: Physical Medicine and Rehabilitation | Admitting: Physical Medicine and Rehabilitation

## 2022-04-08 VITALS — Ht 69.0 in | Wt 203.0 lb

## 2022-04-08 DIAGNOSIS — S060X0A Concussion without loss of consciousness, initial encounter: Secondary | ICD-10-CM

## 2022-04-08 DIAGNOSIS — M542 Cervicalgia: Secondary | ICD-10-CM | POA: Diagnosis not present

## 2022-04-08 DIAGNOSIS — S060X0S Concussion without loss of consciousness, sequela: Secondary | ICD-10-CM | POA: Diagnosis not present

## 2022-04-08 DIAGNOSIS — R45851 Suicidal ideations: Secondary | ICD-10-CM

## 2022-04-08 DIAGNOSIS — R4689 Other symptoms and signs involving appearance and behavior: Secondary | ICD-10-CM

## 2022-04-08 DIAGNOSIS — R4189 Other symptoms and signs involving cognitive functions and awareness: Secondary | ICD-10-CM

## 2022-04-08 MED ORDER — DULOXETINE HCL 30 MG PO CPEP: 30.0000 mg | ORAL_CAPSULE | Freq: Every day | ORAL | 3 refills | Status: DC

## 2022-04-08 NOTE — Patient Instructions (Addendum)
I have referred you to PT for neck exercises to help with pain and headaches. Also, use over the counter voltaren gel to your neck and shoulders up to 4 times daily for pain.   I have prescribed Duloxetine 30 mg daily for nerve pain and depression. Please call me or message through Mychart in 2 weeks to let me know how this medication is going, or sooner if any concerns. I have referred you to a psychiatrist who can assist in management once you are established with them.  I have ordered an MRI of your brain, and will contact you once results are available.  I have written a letter for academic accomodations and have not cleared you to return to play. If things are improving, you may try light aerobic exercise on your own for 10 minutes at a time, and continue this if it does not worsen your fatigue or headaches.   See me in 3-4 weeks for re-evaluation.

## 2022-04-13 DIAGNOSIS — R4189 Other symptoms and signs involving cognitive functions and awareness: Secondary | ICD-10-CM | POA: Insufficient documentation

## 2022-04-13 DIAGNOSIS — S060X0A Concussion without loss of consciousness, initial encounter: Secondary | ICD-10-CM | POA: Insufficient documentation

## 2022-04-13 DIAGNOSIS — R45851 Suicidal ideations: Secondary | ICD-10-CM | POA: Insufficient documentation

## 2022-04-13 DIAGNOSIS — M542 Cervicalgia: Secondary | ICD-10-CM | POA: Insufficient documentation

## 2022-04-13 NOTE — Assessment & Plan Note (Signed)
I have prescribed Duloxetine 30 mg daily for nerve pain and depression. Please call me or message through Mychart in 2 weeks to let me know how this medication is going, or sooner if any concerns. I have referred you to a psychiatrist who can assist in management once you are established with them.

## 2022-04-13 NOTE — Assessment & Plan Note (Signed)
I have referred you to PT for neck exercises to help with pain and headaches. Also, use over the counter voltaren gel to your neck and shoulders up to 4 times daily for pain.

## 2022-04-13 NOTE — Assessment & Plan Note (Addendum)
D/t multiple head strikes during football game 10/2021, no LOC, was not removed from play.  Ongoing symptoms are cognitive fatigue, neck pain, headaches, and worsening of pre-existing depression.   I have written a letter for academic accomodations and have not cleared you to return to play. If things are improving, you may try light aerobic exercise on your own for 10 minutes at a time, and continue this if it does not worsen your fatigue or headaches.

## 2022-04-13 NOTE — Assessment & Plan Note (Addendum)
I have ordered an MRI of your brain, and will contact you once results are available.  See me in 3-4 weeks for re-evaluation.

## 2022-04-21 ENCOUNTER — Ambulatory Visit
Admission: RE | Admit: 2022-04-21 | Discharge: 2022-04-21 | Disposition: A | Discharge status: Home / Self Care | Payer: Medicaid Other | Source: Ambulatory Visit | Attending: Physical Medicine and Rehabilitation | Admitting: Physical Medicine and Rehabilitation

## 2022-04-21 ENCOUNTER — Encounter: Payer: Self-pay | Admitting: Physical Medicine and Rehabilitation

## 2022-04-21 DIAGNOSIS — R4189 Other symptoms and signs involving cognitive functions and awareness: Secondary | ICD-10-CM

## 2022-04-21 DIAGNOSIS — S060X0S Concussion without loss of consciousness, sequela: Secondary | ICD-10-CM

## 2022-05-04 ENCOUNTER — Encounter
Payer: Medicaid Other | Attending: Physical Medicine and Rehabilitation | Admitting: Physical Medicine and Rehabilitation

## 2022-05-04 VITALS — BP 122/78 | HR 47 | Ht 69.0 in | Wt 203.0 lb

## 2022-05-04 DIAGNOSIS — R110 Nausea: Secondary | ICD-10-CM | POA: Insufficient documentation

## 2022-05-04 DIAGNOSIS — R519 Headache, unspecified: Secondary | ICD-10-CM | POA: Insufficient documentation

## 2022-05-04 DIAGNOSIS — S060X0A Concussion without loss of consciousness, initial encounter: Secondary | ICD-10-CM

## 2022-05-04 DIAGNOSIS — S060X0S Concussion without loss of consciousness, sequela: Secondary | ICD-10-CM | POA: Insufficient documentation

## 2022-05-04 DIAGNOSIS — R4189 Other symptoms and signs involving cognitive functions and awareness: Secondary | ICD-10-CM | POA: Diagnosis not present

## 2022-05-04 DIAGNOSIS — M542 Cervicalgia: Secondary | ICD-10-CM | POA: Insufficient documentation

## 2022-05-04 DIAGNOSIS — R4689 Other symptoms and signs involving appearance and behavior: Secondary | ICD-10-CM | POA: Insufficient documentation

## 2022-05-04 DIAGNOSIS — F32A Depression, unspecified: Secondary | ICD-10-CM | POA: Insufficient documentation

## 2022-05-04 NOTE — Patient Instructions (Signed)
I am looking to get you to physical therapy, you could use a few sessions to work on your balance and endurance as you gradually return to play.  Please obtain Voltaren gel over-the-counter and use on your neck and shoulders up to 4 times per day.  You can alternate this with Tylenol 1000 mg up to 3 times per day.  If you take this regimen every day for longer than 2 weeks, without any improvement in your symptoms, please call the clinic and I can add on some adjunctive medications to help you.  If things are getting better, you can stop duloxetine and see if your symptoms worsen.  If they do, you can resume the medication safely at its current dose, although it would not increase it given your symptoms of nausea.  This medication can be managed by your primary care doctor in the future.  You are cleared to start a standard return to play protocol through your sports PT at school.  I will write a letter today saying you are cleared to start aerobic training without contact, and to progress every 48 hours if asymptomatic.  If things worsen or fail to improve with the following measures, you can make a appointment with the office to see me again.  However,  I will not schedule future appointments unless you think you need them, given your improvement.
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
## 2023-10-21 ENCOUNTER — Ambulatory Visit (HOSPITAL_COMMUNITY)
Admission: EM | Admit: 2023-10-21 | Discharge: 2023-10-21 | Disposition: A | Discharge status: Home / Self Care | Payer: Self-pay | Attending: Family Medicine | Admitting: Family Medicine

## 2023-10-21 ENCOUNTER — Encounter (HOSPITAL_COMMUNITY): Payer: Self-pay

## 2023-10-21 DIAGNOSIS — R001 Bradycardia, unspecified: Secondary | ICD-10-CM

## 2023-10-21 DIAGNOSIS — R55 Syncope and collapse: Secondary | ICD-10-CM

## 2023-10-21 DIAGNOSIS — T23152A Burn of first degree of left palm, initial encounter: Secondary | ICD-10-CM

## 2023-10-21 LAB — POCT URINALYSIS DIP (MANUAL ENTRY)
Bilirubin, UA: NEGATIVE
Blood, UA: NEGATIVE
Glucose, UA: NEGATIVE mg/dL
Ketones, POC UA: NEGATIVE mg/dL
Leukocytes, UA: NEGATIVE
Nitrite, UA: NEGATIVE
Protein Ur, POC: NEGATIVE mg/dL
Spec Grav, UA: 1.02 (ref 1.010–1.025)
Urobilinogen, UA: 0.2 U/dL
pH, UA: 7.5 (ref 5.0–8.0)

## 2023-10-21 LAB — POCT FASTING CBG KUC MANUAL ENTRY: POCT Glucose (KUC): 98 mg/dL (ref 70–99)

## 2023-10-21 MED ORDER — SILVER SULFADIAZINE 1 % EX CREA: TOPICAL_CREAM | Freq: Once | CUTANEOUS | Status: DC

## 2023-10-21 MED ORDER — SILVER SULFADIAZINE 1 % EX CREA
TOPICAL_CREAM | CUTANEOUS | Status: AC
  Filled 2023-10-21: qty 85
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
## 2023-10-21 NOTE — ED Triage Notes (Signed)
 Patient here today with c/o loss of consciousness last night 3 times. The first time he was while he was cooking. Patient felt a sharp pain on the top of his head and passed out. During this time, he burned his left hand. After patient got up, he past out again on his way to his bedroom and then again in the bedroom. Patient woke up this morning with a large blister on his hand.

## 2023-10-21 NOTE — Discharge Instructions (Addendum)
 You were seen today for a burn to the hand from passing out.  I have drained the blister.  Please use the silvadene  cream three times/day and keep the wound covered as discussed.    Your EKG showed a heart rate of 40, which is lower than your normal, and could have contributed to the passing out.  I do recommend you follow up with your cardiologist for further discussion.  Please call your cardiologist at [phone] for an appointment.

## 2023-11-01 ENCOUNTER — Encounter (HOSPITAL_COMMUNITY): Payer: Self-pay

## 2023-11-01 ENCOUNTER — Ambulatory Visit (HOSPITAL_COMMUNITY)
Admission: EM | Admit: 2023-11-01 | Discharge: 2023-11-01 | Disposition: A | Discharge status: Home / Self Care | Payer: Self-pay

## 2023-11-01 DIAGNOSIS — T23052D Burn of unspecified degree of left palm, subsequent encounter: Secondary | ICD-10-CM

## 2023-11-01 DIAGNOSIS — L089 Local infection of the skin and subcutaneous tissue, unspecified: Secondary | ICD-10-CM

## 2023-11-01 MED ORDER — MUPIROCIN CALCIUM 2 % EX CREA: 1.0000 | TOPICAL_CREAM | Freq: Two times a day (BID) | CUTANEOUS | 0 refills | Status: DC

## 2023-11-01 MED ORDER — SULFAMETHOXAZOLE-TRIMETHOPRIM 800-160 MG PO TABS: 1.0000 | ORAL_TABLET | Freq: Two times a day (BID) | ORAL | 0 refills | Status: AC

## 2023-11-01 NOTE — Discharge Instructions (Signed)
 Take antibiotic twice daily at breakfast and dinner for next 7 days  Apply mupirocin cream twice daily for next 10 days  Follow-up if any signs of persistent redness, swelling, pain, discharge or if you feel like antibiotics are not adequately treating infection.

## 2023-11-01 NOTE — ED Provider Notes (Signed)
 [facility]    CSN: [id number] Arrival date & time: 11/01/23  9097      History   Chief Complaint Chief Complaint  Patient presents with   Blister    HPI Kyle Pacheco. is a 23 y.o. male.   HPI  Patient is a 23 year old male who comes in today complaining of blisters concerning they may be infected.  Patient has a past medical history of depression, suicidal ideations, concussion, neck pain.  Patient is here roughly 2 weeks ago after he passed out while cooking and burned his hand on the stove.  Patient says he has got his blister from this incident. He had blisters drained at last visit and was given Silvadene  cream to use 3 times daily.  Of note, he is right was 40 bpm at last visit. At last visit he was given referral to cardiology for further treatment of medical care.  He was advised to have blood work done but he does not have insurance and declined.   Today patient states over last couple days he experienced drainage from the blister on his left palm and has noted purulent discharge.  Endorses some mild increased redness.  Still has pain over burning with palpation.  Has been using burn cream as prescribed but has not taken any antibiotics either oral or topical so far.  Past Medical History:  Diagnosis Date   Pneumonia 2015   RLL    Patient Active Problem List   Diagnosis Date Noted   Concussion with no loss of consciousness 04/13/2022   Cervicalgia 04/13/2022   Cognitive and behavioral changes 04/13/2022   Passive suicidal ideations 04/13/2022    Past Surgical History:  Procedure Laterality Date   CIRCUMCISION     at 23 years of age       Home Medications    Prior to Admission medications   Medication Sig Start Date End Date Taking? Authorizing Provider  DULoxetine  (CYMBALTA ) 30 MG capsule Take 1 capsule (30 mg total) by mouth daily. Patient not taking: Reported on 10/21/2023 04/08/22   Emeline Joesph BROCKS, DO    Family History Family History   Problem Relation Age of Onset   Hypertension Mother    Hypertension Maternal Grandmother    Diabetes Maternal Grandfather    Hypertension Maternal Grandfather     Social History Social History   Tobacco Use   Smoking status: Never    Passive exposure: Yes   Smokeless tobacco: Never  Vaping Use   Vaping status: Never Used  Substance Use Topics   Alcohol use: No   Drug use: No     Allergies   Patient has no known allergies.   Review of Systems Review of Systems  ROS negative except as noted in HPI above   Physical Exam Triage Vital Signs ED Triage Vitals [11/01/23 0915]  Encounter Vitals Group     BP 120/71     Girls Systolic BP Percentile      Girls Diastolic BP Percentile      Boys Systolic BP Percentile      Boys Diastolic BP Percentile      Pulse Rate (!) 47     Resp 16     Temp 98.1 F (36.7 C)     Temp Source Oral     SpO2 99 %     Weight      Height      Head Circumference      Peak Flow  Pain Score 0     Pain Loc      Pain Education      Exclude from Growth Chart    No data found.  Updated Vital Signs BP 120/71 (BP Location: Right Arm)   Pulse (!) 47   Temp 98.1 F (36.7 C) (Oral)   Resp 16   SpO2 99%   Visual Acuity Right Eye Distance:   Left Eye Distance:   Bilateral Distance:    Right Eye Near:   Left Eye Near:    Bilateral Near:     Physical Exam Vitals and nursing note reviewed.  Constitutional:      General: He is not in acute distress.    Appearance: He is well-developed.  HENT:     Head: Normocephalic and atraumatic.  Eyes:     Conjunctiva/sclera: Conjunctivae normal.  Cardiovascular:     Rate and Rhythm: Normal rate and regular rhythm.     Heart sounds: No murmur heard. Pulmonary:     Effort: Pulmonary effort is normal. No respiratory distress.     Breath sounds: Normal breath sounds.  Abdominal:     Palpations: Abdomen is soft.     Tenderness: There is no abdominal tenderness.  Musculoskeletal:         General: No swelling.     Cervical back: Neck supple.  Skin:    General: Skin is warm and dry.     Capillary Refill: Capillary refill takes less than 2 seconds.     Findings: Lesion present.         Comments: Large blister on hypothenar eminence of left hand with suppurative malodorous fluid and draining  from it.  No significant erythema surrounding the blister.  No proximal spreading erythema.  Neuro vastly intact.  No increased swelling.  Neurological:     Mental Status: He is alert.  Psychiatric:        Mood and Affect: Mood normal.      UC Treatments / Results  Labs (all labs ordered are listed, but only abnormal results are displayed) Labs Reviewed - No data to display  EKG   Radiology No results found.  Procedures Procedures (including critical care time)  Medications Ordered in UC Medications - No data to display  Initial Impression / Assessment and Plan / UC Course  I have reviewed the triage vital signs and the nursing notes.  Pertinent labs & imaging results that were available during my care of the patient were reviewed by me and considered in my medical decision making (see chart for details).     Blisters Final Clinical Impressions(s) / UC Diagnoses   # Secondary infection of burn on left palm - Drained blister on left palm using 18-gauge needle which revealed purulent malodorous discharge and blister.  No significant erythema or increased calor.  Regardless we will cover for secondary infection -Denies any repeat syncopal episodes.  Still plans to follow-up with his cardiologist. - Set prescription for Bactrim DS as well as mupirocin cream to apply twice daily for next 7-10 days. - Wrapped burn and nonadhesive padding with Coban - Instructed patient to follow-up with his PCP or return if secondary signs of infections do not go away with antibiotic treatment -- Patient understands and agrees to treatment plan.  No further questions or concerns at this  time.   Final diagnoses:  None   Discharge Instructions   None    ED Prescriptions   None    PDMP not reviewed this  encounter.   Lynwood Barter, DO 11/01/23 [phone]

## 2023-11-01 NOTE — ED Triage Notes (Addendum)
 Patient here today for a follow up of blisters on his left hand. Patient states that the swelling has improved but still having some pain and tenderness with doing certain things. Denies pain currently. Patient also has a couple of blisters on his 4th and 5th digits of his left hand that he would like popped.

## 2023-11-12 ENCOUNTER — Encounter (HOSPITAL_COMMUNITY): Payer: Self-pay

## 2023-11-12 ENCOUNTER — Ambulatory Visit (HOSPITAL_COMMUNITY)
Admission: EM | Admit: 2023-11-12 | Discharge: 2023-11-12 | Disposition: A | Discharge status: Home / Self Care | Payer: Self-pay

## 2023-11-12 DIAGNOSIS — R197 Diarrhea, unspecified: Secondary | ICD-10-CM

## 2023-11-12 DIAGNOSIS — K529 Noninfective gastroenteritis and colitis, unspecified: Secondary | ICD-10-CM

## 2023-11-12 NOTE — ED Provider Notes (Signed)
 [facility]    CSN: [id number] Arrival date & time: 11/12/23  1904      History   Chief Complaint Chief Complaint  Patient presents with   Abdominal Pain   Diarrhea    HPI Kyle Pacheco. is a 23 y.o. male.  Ate honey chicken last night, woke early this morning with nausea and diarrhea. Nausea is subsiding. He tolerates fluids. Did not have any vomiting Had to miss work today and needs note   Past Medical History:  Diagnosis Date   Pneumonia 2015   RLL    Patient Active Problem List   Diagnosis Date Noted   Concussion with no loss of consciousness 04/13/2022   Cervicalgia 04/13/2022   Cognitive and behavioral changes 04/13/2022   Passive suicidal ideations 04/13/2022    Past Surgical History:  Procedure Laterality Date   CIRCUMCISION     at 23 years of age     Home Medications    Prior to Admission medications   Medication Sig Start Date End Date Taking? Authorizing Provider  DULoxetine  (CYMBALTA ) 30 MG capsule Take 1 capsule (30 mg total) by mouth daily. Patient not taking: Reported on 10/21/2023 04/08/22   Emeline Joesph BROCKS, DO  mupirocin cream (BACTROBAN) 2 % Apply 1 Application topically 2 (two) times daily. 11/01/23   Lynwood Barter, DO    Family History Family History  Problem Relation Age of Onset   Hypertension Mother    Hypertension Maternal Grandmother    Diabetes Maternal Grandfather    Hypertension Maternal Grandfather     Social History Social History   Tobacco Use   Smoking status: Never    Passive exposure: Yes   Smokeless tobacco: Never  Vaping Use   Vaping status: Never Used  Substance Use Topics   Alcohol use: No   Drug use: No     Allergies   Patient has no known allergies.   Review of Systems Review of Systems  As per HPI  Physical Exam Triage Vital Signs ED Triage Vitals [11/12/23 2004]  Encounter Vitals Group     BP (!) 114/59     Girls Systolic BP Percentile      Girls Diastolic BP Percentile       Boys Systolic BP Percentile      Boys Diastolic BP Percentile      Pulse Rate (!) 44     Resp 18     Temp 98 F (36.7 C)     Temp Source Oral     SpO2 100 %     Weight      Height      Head Circumference      Peak Flow      Pain Score 4     Pain Loc      Pain Education      Exclude from Growth Chart    No data found.  Updated Vital Signs BP (!) 114/59 (BP Location: Right Arm)   Pulse (!) 46   Temp 98 F (36.7 C) (Oral)   Resp 18   SpO2 100%    Physical Exam Vitals and nursing note reviewed.  Constitutional:      Appearance: Normal appearance.  HENT:     Mouth/Throat:     Mouth: Mucous membranes are moist.     Pharynx: Oropharynx is clear.  Eyes:     Conjunctiva/sclera: Conjunctivae normal.  Cardiovascular:     Rate and Rhythm: Regular rhythm. Bradycardia present.     Heart  sounds: Normal heart sounds.     Comments: Baseline. Athlete  Pulmonary:     Effort: Pulmonary effort is normal. No respiratory distress.     Breath sounds: Normal breath sounds.  Abdominal:     General: Bowel sounds are normal.     Palpations: Abdomen is soft.     Tenderness: There is no abdominal tenderness. There is no right CVA tenderness, left CVA tenderness, guarding or rebound.  Musculoskeletal:        General: Normal range of motion.  Skin:    General: Skin is warm and dry.  Neurological:     Mental Status: He is alert and oriented to person, place, and time.     UC Treatments / Results  Labs (all labs ordered are listed, but only abnormal results are displayed) Labs Reviewed - No data to display  EKG   Radiology No results found.  Procedures Procedures (including critical care time)  Medications Ordered in UC Medications - No data to display  Initial Impression / Assessment and Plan / UC Course  I have reviewed the triage vital signs and the nursing notes.  Pertinent labs & imaging results that were available during my care of the patient were reviewed by me  and considered in my medical decision making (see chart for details).  Declines nausea medicine in clinic and declines prescription Reports feeling better Note for work is provided, hydration importance, return precautions  Final Clinical Impressions(s) / UC Diagnoses   Final diagnoses:  Diarrhea, unspecified type  Gastroenteritis     Discharge Instructions      Drink lots of water and fluids to stay hydrated!     ED Prescriptions   None    PDMP not reviewed this encounter.   Todrick Siedschlag, PA-C 11/12/23 2033

## 2023-11-12 NOTE — Discharge Instructions (Signed)
 Drink lots of water and fluids to stay hydrated!

## 2023-11-12 NOTE — ED Triage Notes (Signed)
 Pt c/o center to lower abdominal cramping with diarrhea since 3 or 4am. States had honey chicken last night for first time. States took peptoe with relief. States needs a work note.

## 2023-11-23 ENCOUNTER — Encounter (HOSPITAL_COMMUNITY): Payer: Self-pay

## 2023-11-23 ENCOUNTER — Ambulatory Visit (HOSPITAL_COMMUNITY)
Admission: EM | Admit: 2023-11-23 | Discharge: 2023-11-23 | Disposition: A | Discharge status: Home / Self Care | Payer: Self-pay

## 2023-11-23 DIAGNOSIS — J069 Acute upper respiratory infection, unspecified: Secondary | ICD-10-CM

## 2023-11-23 NOTE — Discharge Instructions (Signed)

## 2023-11-23 NOTE — ED Provider Notes (Signed)
 [facility]    CSN: [id number] Arrival date & time: 11/23/23  1955      History   Chief Complaint Chief Complaint  Patient presents with   Nasal Congestion   Sore Throat   Cough    HPI Kyle Pacheco. is a 23 y.o. male.   Patient presents today due to throat pain, dry cough, and nasal congestion that started yesterday.  Patient states that he has sick contacts at work but states that he also works in a very cold environment.  Patient denies use of anything for symptoms.  Patient admits to subjective fever but denies nausea, vomiting, or chills.  The history is provided by the patient.  Sore Throat  Cough   Past Medical History:  Diagnosis Date   Pneumonia 2015   RLL    Patient Active Problem List   Diagnosis Date Noted   Concussion with no loss of consciousness 04/13/2022   Cervicalgia 04/13/2022   Cognitive and behavioral changes 04/13/2022   Passive suicidal ideations 04/13/2022    Past Surgical History:  Procedure Laterality Date   CIRCUMCISION     at 23 years of age       Home Medications    Prior to Admission medications   Medication Sig Start Date End Date Taking? Authorizing Provider  DULoxetine  (CYMBALTA ) 30 MG capsule Take 1 capsule (30 mg total) by mouth daily. Patient not taking: Reported on 10/21/2023 04/08/22   Emeline Joesph BROCKS, DO  mupirocin cream (BACTROBAN) 2 % Apply 1 Application topically 2 (two) times daily. Patient not taking: Reported on 11/23/2023 11/01/23   Lynwood Barter, DO    Family History Family History  Problem Relation Age of Onset   Hypertension Mother    Hypertension Maternal Grandmother    Diabetes Maternal Grandfather    Hypertension Maternal Grandfather     Social History Social History   Tobacco Use   Smoking status: Never    Passive exposure: Yes   Smokeless tobacco: Never  Vaping Use   Vaping status: Never Used  Substance Use Topics   Alcohol use: No   Drug use: No     Allergies    Patient has no known allergies.   Review of Systems Review of Systems  Respiratory:  Positive for cough.      Physical Exam Triage Vital Signs ED Triage Vitals [11/23/23 2019]  Encounter Vitals Group     BP 138/72     Girls Systolic BP Percentile      Girls Diastolic BP Percentile      Boys Systolic BP Percentile      Boys Diastolic BP Percentile      Pulse Rate 65     Resp 16     Temp 98.1 F (36.7 C)     Temp Source Oral     SpO2 97 %     Weight      Height      Head Circumference      Peak Flow      Pain Score 8     Pain Loc      Pain Education      Exclude from Growth Chart    No data found.  Updated Vital Signs BP 138/72 (BP Location: Right Arm)   Pulse 65   Temp 98.1 F (36.7 C) (Oral)   Resp 16   SpO2 97%   Visual Acuity Right Eye Distance:   Left Eye Distance:   Bilateral Distance:  Right Eye Near:   Left Eye Near:    Bilateral Near:     Physical Exam Vitals and nursing note reviewed.  Constitutional:      General: He is not in acute distress.    Appearance: Normal appearance. He is not ill-appearing, toxic-appearing or diaphoretic.  HENT:     Nose: Congestion (markedly enlarged turbinates) present. No rhinorrhea.     Mouth/Throat:     Mouth: Mucous membranes are moist.     Pharynx: Oropharynx is clear. Posterior oropharyngeal erythema (mild) present. No oropharyngeal exudate.  Eyes:     General: No scleral icterus. Cardiovascular:     Rate and Rhythm: Normal rate and regular rhythm.     Heart sounds: Normal heart sounds.  Pulmonary:     Effort: Pulmonary effort is normal. No respiratory distress.     Breath sounds: Normal breath sounds. No wheezing or rhonchi.  Skin:    General: Skin is warm.  Neurological:     Mental Status: He is alert and oriented to person, place, and time.  Psychiatric:        Mood and Affect: Mood normal.        Behavior: Behavior normal.      UC Treatments / Results  Labs (all labs ordered are  listed, but only abnormal results are displayed) Labs Reviewed - No data to display  EKG   Radiology No results found.  Procedures Procedures (including critical care time)  Medications Ordered in UC Medications - No data to display  Initial Impression / Assessment and Plan / UC Course  I have reviewed the triage vital signs and the nursing notes.  Pertinent labs & imaging results that were available during my care of the patient were reviewed by me and considered in my medical decision making (see chart for details).     Symptoms most likely due to viral illness, patient given supportive treatment for illness. Final Clinical Impressions(s) / UC Diagnoses   Final diagnoses:  Viral URI     Discharge Instructions      You been diagnosed with a viral illness today. -Viruses have to run their course and medicines that are prescribed are meant to help with symptoms. - With viruses usually feel poorly from 3 to 7 days with cough being the last symptoms to resolve.  -Cough can linger from days to weeks.  Antibiotics are not effective for viruses. -If your cough lasts more than 2 weeks and you are coughing so hard that you are vomiting or feel like you could pass out we need to follow-up with PCP for further testing and evaluation. -Rest, increase water intake, may use pseudoephedrine for nasal congestion, Delsym (dextromethorphan) or honey as needed for cough, and ibuprofen  and/or Tylenol as directed on packaging for pain and fever. -If you have hypertension you should take Coricidin or other OTC meds approved for people with high blood pressure. -You may use a spoonful of honey every 4-6 hours as needed for throat pain and cough. -Warm tea with honey and lemon are helpful for soothe throat as well.  Chloraseptic and Cepacol make a throat lozenge with numbing medication, can be purchased over-the-counter. -May also use Flonase  or sinus rinse for sinus pressure or nasal congestion.   Be sure to use distilled bottled water for sinus rinses. -May use coolmist humidifier to open up nasal passages -May elevate head to assist with postnasal drainage. -If you feel poorly (fever, fatigue, shortness of breath, nausea, etc.) for more than 10  days to be sure to follow-up with PCP or in clinic for further evaluation and additional treatments. If you experience chest pain with shortness of breath or pulse oxygen less than 95% you should report to the ER.      ED Prescriptions   None    PDMP not reviewed this encounter.   Andra Corean BROCKS, PA-C 11/23/23 2053

## 2023-11-23 NOTE — ED Triage Notes (Signed)
 Patient reports that he has nasal congestion, sore throat, and a non productive cough since yesterday.  Patient has not had any medications for his symptoms.

## 2023-12-29 ENCOUNTER — Ambulatory Visit (HOSPITAL_COMMUNITY)
Admission: EM | Admit: 2023-12-29 | Discharge: 2023-12-29 | Disposition: A | Discharge status: Home / Self Care | Payer: Self-pay | Attending: Family Medicine | Admitting: Family Medicine

## 2023-12-29 DIAGNOSIS — R1110 Vomiting, unspecified: Secondary | ICD-10-CM

## 2023-12-29 MED ORDER — ONDANSETRON 4 MG PO TBDP: 4.0000 mg | ORAL_TABLET | Freq: Three times a day (TID) | ORAL | 0 refills | Status: AC | PRN

## 2023-12-29 NOTE — ED Triage Notes (Signed)
 Patient reports diarrhea and vomiting this morning. Reports feeling better but needs a doctors note for work. Eating and drinking well at this time.

## 2023-12-29 NOTE — ED Notes (Signed)
No answer from lobby  

## 2023-12-30 NOTE — ED Provider Notes (Signed)
 [facility]   [id number] 12/29/23 Arrival Time: 1853  ASSESSMENT & PLAN:  1. Vomiting, unspecified vomiting type, unspecified whether nausea present    Symptoms have improved throughout day. Work note provided. Meds ordered this encounter  Medications   ondansetron (ZOFRAN-ODT) 4 MG disintegrating tablet    Sig: Take 1 tablet (4 mg total) by mouth every 8 (eight) hours as needed for nausea or vomiting.    Dispense:  15 tablet    Refill:  0   May return as needed.  Reviewed expectations re: course of current medical issues. Questions answered. Outlined signs and symptoms indicating need for more acute intervention. Patient verbalized understanding. After Visit Summary given.   SUBJECTIVE: History from: patient.  Kyle Pacheco. is a 23 y.o. male who presents with complaint of non-bilious, non-bloody intermittent n/v without non-bloody diarrhea. Onset today. Denies abd pain and fever. Feeling better now. Needs work note.   Past Surgical History:  Procedure Laterality Date   CIRCUMCISION     at 23 years of age     OBJECTIVE:  Vitals:   12/29/23 2003  BP: 108/62  Pulse: 96  Resp: 18  Temp: 98 F (36.7 C)  TempSrc: Oral  SpO2: 98%    General appearance: alert; no distress Oropharynx: moist Lungs: clear to auscultation bilaterally; unlabored Heart: regular rate and rhythm Abdomen: soft; non-distended Back: no CVA tenderness Extremities: no edema; symmetrical with no gross deformities Skin: warm; dry Neurologic: normal gait Psychological: alert and cooperative; normal mood and affect  Labs:  Labs Reviewed - No data to display  Imaging: No results found.  No Known Allergies                                             Past Medical History:  Diagnosis Date   Pneumonia 2015   RLL   Social History   Socioeconomic History   Marital status: Single    Spouse name: Not on file   Number of children: Not on file   Years of education: Not on  file   Highest education level: Not on file  Occupational History   Not on file  Tobacco Use   Smoking status: Never    Passive exposure: Yes   Smokeless tobacco: Never  Vaping Use   Vaping status: Never Used  Substance and Sexual Activity   Alcohol use: No   Drug use: No   Sexual activity: Not on file  Other Topics Concern   Not on file  Social History Narrative   Not on file   Social Drivers of Health   Financial Resource Strain: Medium Risk (04/24/2021)   Received from [facility]   Overall Financial Resource Strain (CARDIA)    Difficulty of Paying Living Expenses: Somewhat hard  Food Insecurity: Food Insecurity Present (04/24/2021)   Received from [facility]   Hunger Vital Sign    Within the past 12 months, you worried that your food would run out before you got the money to buy more.: Sometimes true    Within the past 12 months, the food you bought just didn't last and you didn't have money to get more.: Sometimes true  Transportation Needs: Unmet Transportation Needs (04/24/2021)   Received from [facility] - Transportation    Lack of Transportation (Medical): No    Lack of Transportation (Non-Medical): Yes  Physical Activity: Sufficiently Active (04/24/2021)   Received from [facility]   Exercise Vital Sign    On average, how many days per week do you engage in moderate to strenuous exercise (like a brisk walk)?: 3 days    On average, how many minutes do you engage in exercise at this level?: 130 min  Stress: Stress Concern Present (04/24/2021)   Received from [facility] - Occupational Stress Questionnaire    Feeling of Stress : Very much  Social Connections: Unknown (04/25/2022)   Received from [facility]   Social Network    Social Network: Not on file  Intimate Partner Violence: Unknown (04/25/2022)   Received from [facility]   HITS    Physically Hurt: Not on file    Insult or Talk Down To:  Not on file    Threaten Physical Harm: Not on file    Scream or Curse: Not on file   Family History  Problem Relation Age of Onset   Hypertension Mother    Hypertension Maternal Grandmother    Diabetes Maternal Grandfather    Hypertension Maternal Apolinar Rolinda Rogue, MD 12/30/23 [phone]
